# Patient Record
Sex: Male | Born: 1970 | Race: White | Hispanic: No | Marital: Single | Smoking: Never smoker
Health system: Southern US, Community
[De-identification: ages and names within clinical notes are randomized; demographics above are authoritative.]

## PROBLEM LIST (undated history)

## (undated) DIAGNOSIS — R030 Elevated blood-pressure reading, without diagnosis of hypertension: Secondary | ICD-10-CM

## (undated) DIAGNOSIS — M779 Enthesopathy, unspecified: Secondary | ICD-10-CM

## (undated) DIAGNOSIS — K4020 Bilateral inguinal hernia, without obstruction or gangrene, not specified as recurrent: Secondary | ICD-10-CM

## (undated) HISTORY — PX: INGUINAL HERNIA REPAIR: SUR1180

## (undated) HISTORY — PX: LASIK: SHX215

## (undated) HISTORY — PX: SHOULDER SURGERY: SHX246

---

## 2005-02-21 ENCOUNTER — Emergency Department: Payer: Self-pay | Admitting: Emergency Medicine

## 2005-03-01 ENCOUNTER — Ambulatory Visit: Payer: Self-pay | Admitting: General Surgery

## 2006-02-22 ENCOUNTER — Ambulatory Visit: Payer: Self-pay | Admitting: Internal Medicine

## 2006-03-13 ENCOUNTER — Ambulatory Visit: Payer: Self-pay | Admitting: Internal Medicine

## 2006-05-28 ENCOUNTER — Ambulatory Visit: Payer: Self-pay | Admitting: Family Medicine

## 2007-10-23 ENCOUNTER — Emergency Department: Payer: Self-pay | Admitting: Emergency Medicine

## 2007-10-23 ENCOUNTER — Other Ambulatory Visit: Payer: Self-pay

## 2007-10-31 ENCOUNTER — Ambulatory Visit: Payer: Self-pay | Admitting: Internal Medicine

## 2007-11-26 ENCOUNTER — Ambulatory Visit: Payer: Self-pay | Admitting: Orthopaedic Surgery

## 2008-09-16 ENCOUNTER — Ambulatory Visit: Payer: Self-pay | Admitting: Unknown Physician Specialty

## 2008-12-06 ENCOUNTER — Ambulatory Visit: Payer: Self-pay | Admitting: Pain Medicine

## 2013-02-20 ENCOUNTER — Ambulatory Visit: Payer: Self-pay | Admitting: Unknown Physician Specialty

## 2019-11-19 ENCOUNTER — Other Ambulatory Visit: Payer: Self-pay | Admitting: Family Medicine

## 2019-11-19 ENCOUNTER — Ambulatory Visit
Admission: RE | Admit: 2019-11-19 | Discharge: 2019-11-19 | Disposition: A | Discharge status: Home / Self Care | Payer: Worker's Compensation | Source: Ambulatory Visit | Attending: Family Medicine | Admitting: Family Medicine

## 2019-11-19 DIAGNOSIS — T1490XA Injury, unspecified, initial encounter: Secondary | ICD-10-CM

## 2020-09-29 ENCOUNTER — Ambulatory Visit: Payer: Self-pay | Admitting: Dermatology

## 2020-09-29 NOTE — Patient Instructions (Incomplete)

## 2021-11-21 IMAGING — CR DG SHOULDER 2+V*L*
3 series · 3 of 3 positions shown · non-contrast
Comparison: Left shoulder x-rays dated October 24, 2007.

CLINICAL DATA: Left shoulder pain and limited range of motion since
injury 2 weeks ago.

EXAM:
LEFT SHOULDER - 2+ VIEW

[shoulder grashey]
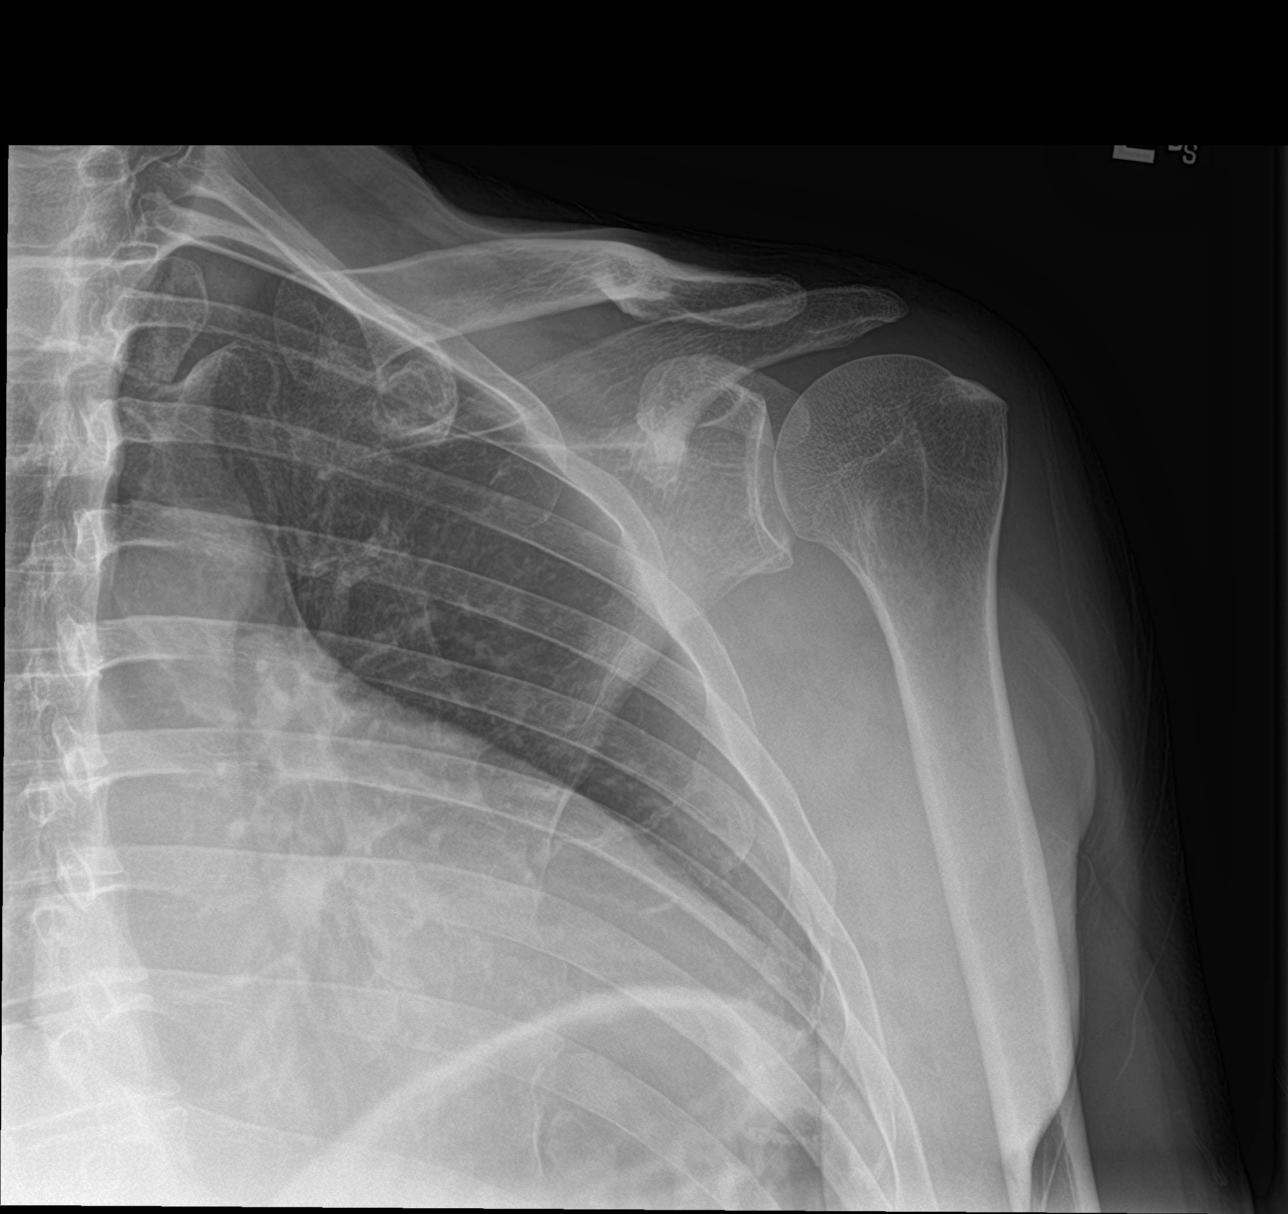

[shoulder y view]
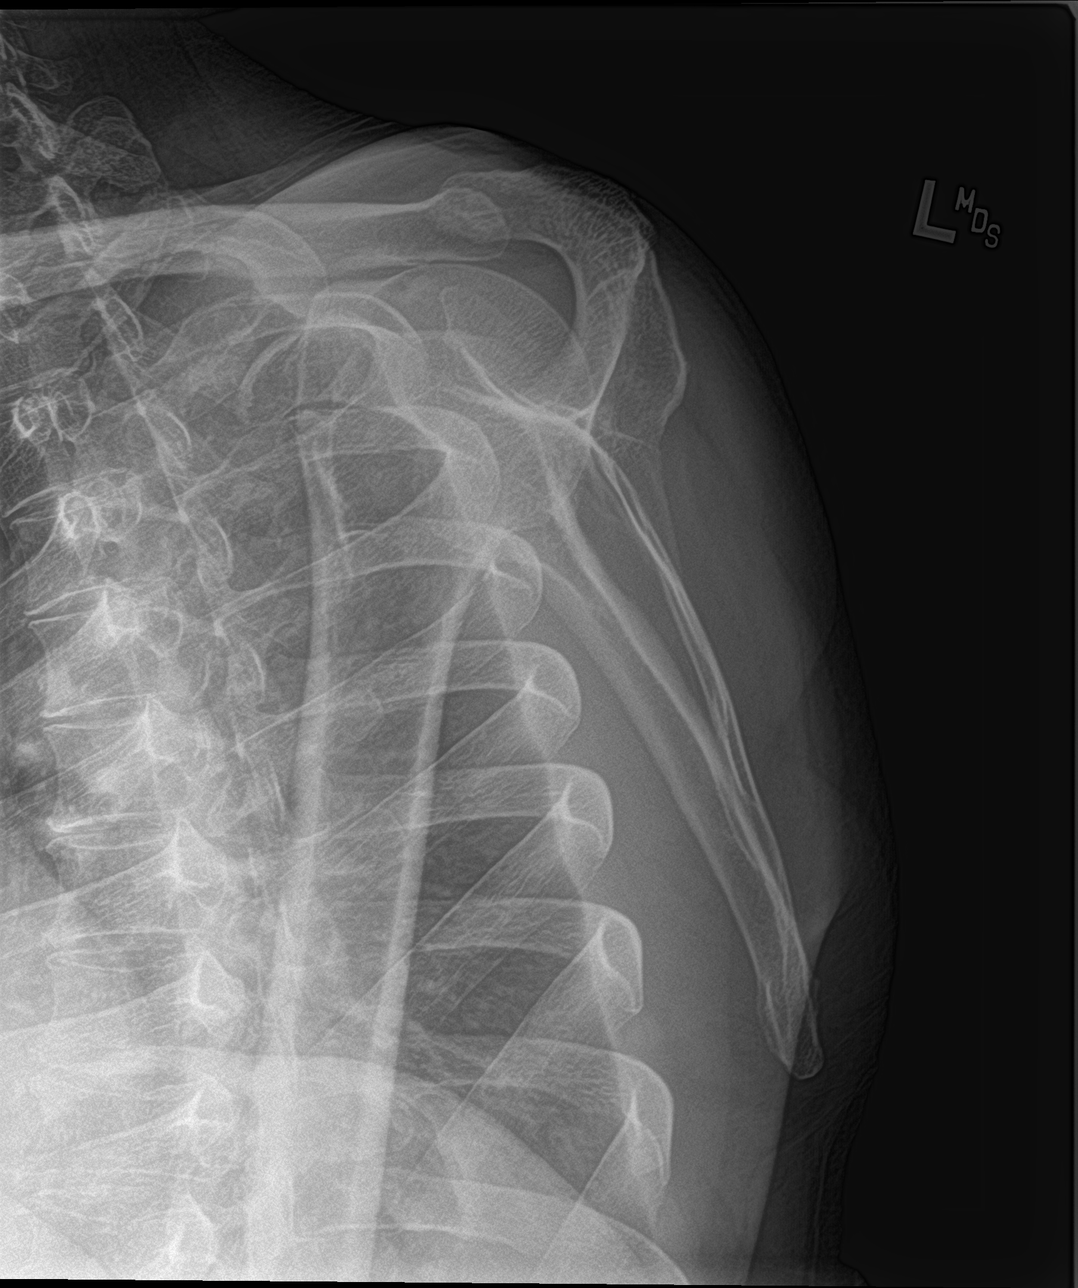

[shoulder axillary]
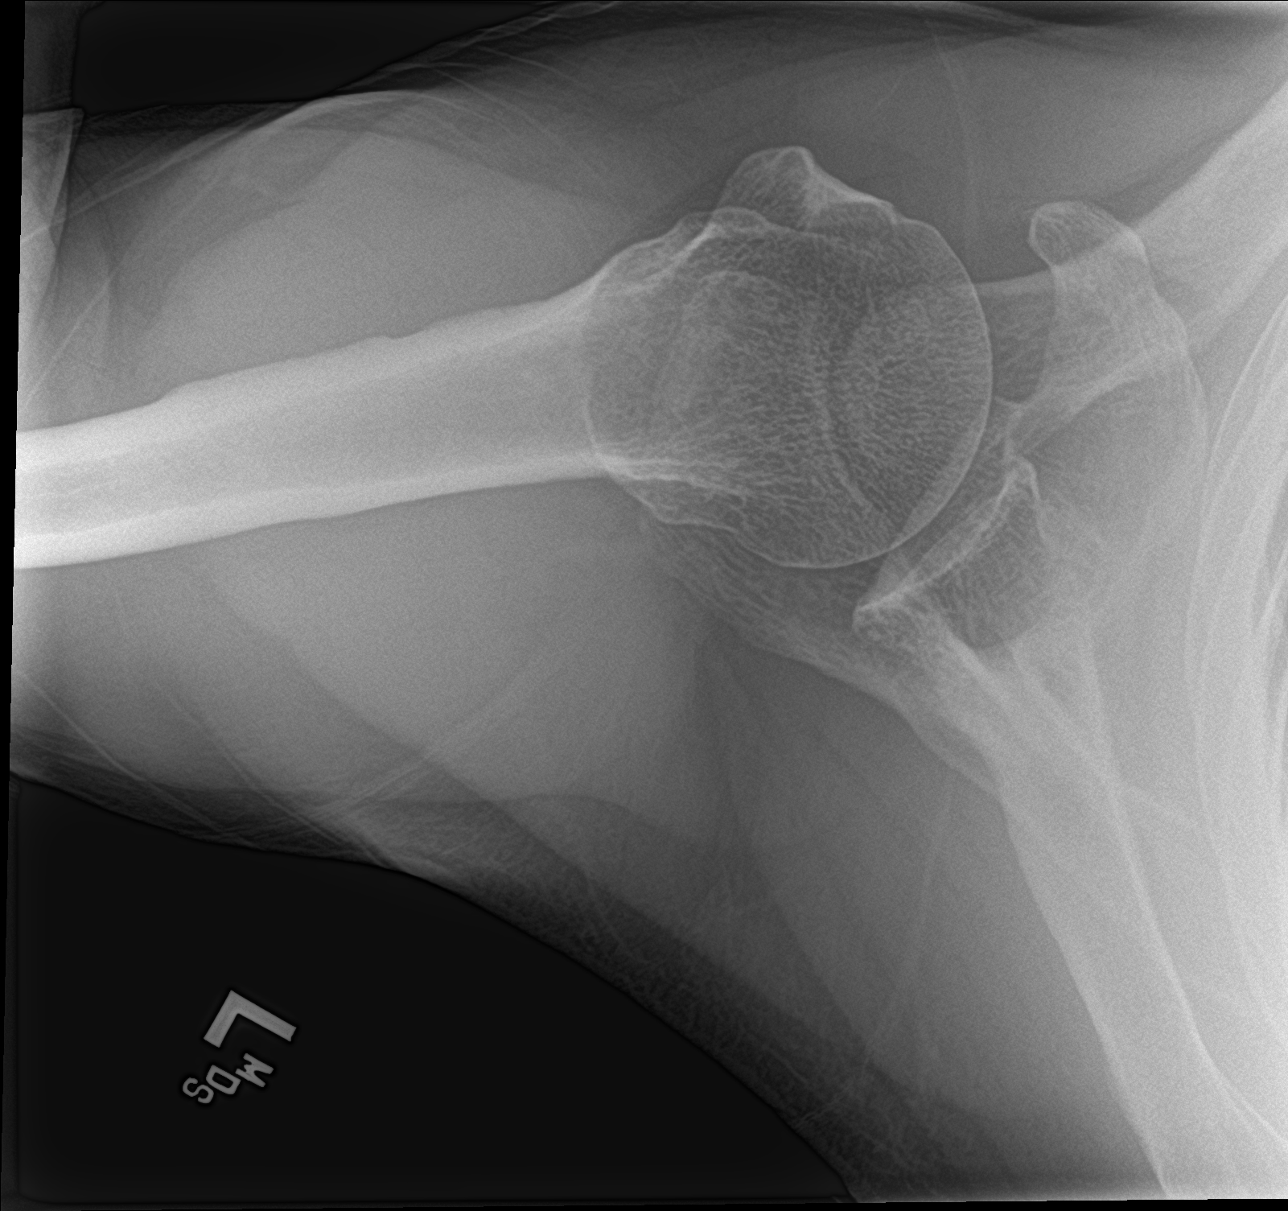

[3 of 3 positions shown; findings below may reference images not displayed]

FINDINGS: There is no evidence of fracture or dislocation. There is no
evidence of arthropathy or other focal bone abnormality. Soft
tissues are unremarkable.
IMPRESSION: Negative.

## 2022-02-16 DEATH — deceased

## 2022-10-08 ENCOUNTER — Ambulatory Visit: Payer: Self-pay | Admitting: Surgery

## 2022-10-08 NOTE — H&P (View-Only) (Signed)
Subjective:   CC: Non-recurrent unilateral inguinal hernia without obstruction or gangrene [K40.90]  HPI:  Victor Coleman is a 51 y.o. male who was referred by Self Referral for evaluation of above. Symptoms were first noted a few years ago. Initially noticed it after lifting at work. Recent worsening of pain so has been out of work. Naproxen not helping.  Associated with nothing, exacerbated by nothing.  Lump is reducible.    Past Medical History: none  Past Surgical History: prior right inguinal hernia repair  Family History: reviewed and not relevant to CC  Social History:  reports that he has never smoked. He has never used smokeless tobacco. He reports that he does not drink alcohol and does not use drugs.  Current Medications: currently has no medications in their medication list.  Allergies:  Allergies as of 10/08/2022   (No Known Allergies)    ROS:  A 15 point review of systems was performed and pertinent positives and negatives noted in HPI   Objective:     BP (!) 188/109   Pulse 110   Ht 175.3 cm (5' 9.02")   Wt 81.7 kg (180 lb 1.9 oz)   BMI 26.59 kg/m   Constitutional :  Alert, cooperative, no distress  Lymphatics/Throat:  Supple, no lymphadenopathy  Respiratory:  clear to auscultation bilaterally  Cardiovascular:  regular rate and rhythm  Gastrointestinal: soft, non-tender; bowel sounds normal; no masses,  no organomegaly. inguinal hernia noted.  moderate, reducible, overlying skin changes, and left  Musculoskeletal: Steady gait and movement  Skin: Cool and moist, right inguinal surgical scars  Psychiatric: Normal affect, non-agitated, not confused       LABS:  N/a   RADS: N/a Assessment:       Non-recurrent unilateral inguinal hernia without obstruction or gangrene [K40.90]  Plan:     1. Non-recurrent unilateral inguinal hernia without obstruction or gangrene [K40.90]   Discussed the risk of surgery including recurrence, which can be up to 50%  in the case of incisional or complex hernias, possible use of prosthetic materials (mesh) and the increased risk of mesh infxn if used, bleeding, chronic pain, post-op infxn, post-op SBO or ileus, and possible re-operation to address said risks. The risks of general anesthetic, if used, includes MI, CVA, sudden death or even reaction to anesthetic medications also discussed. Alternatives include continued observation.  Benefits include possible symptom relief, prevention of incarceration, strangulation, enlargement in size over time, and the risk of emergency surgery in the face of strangulation.   Typical post-op recovery time of 3-5 days with 2 weeks of activity restrictions were also discussed.  ED return precautions given for sudden increase in pain, size of hernia with accompanying fever, nausea, and/or vomiting.  The patient verbalized understanding and all questions were answered to the patient's satisfaction.   2. Patient has elected to proceed with surgical treatment. Procedure will be scheduled. left, robotic assisted laparoscopic  labs/images/medications/previous chart entries reviewed personally and relevant changes/updates noted above.   

## 2022-10-08 NOTE — H&P (Signed)
Subjective:   CC: Non-recurrent unilateral inguinal hernia without obstruction or gangrene [K40.90]  HPI:  Victor Coleman is a 52 y.o. male who was referred by Self Referral for evaluation of above. Symptoms were first noted a few years ago. Initially noticed it after lifting at work. Recent worsening of pain so has been out of work. Naproxen not helping.  Associated with nothing, exacerbated by nothing.  Lump is reducible.    Past Medical History: none  Past Surgical History: prior right inguinal hernia repair  Family History: reviewed and not relevant to CC  Social History:  reports that he has never smoked. He has never used smokeless tobacco. He reports that he does not drink alcohol and does not use drugs.  Current Medications: currently has no medications in their medication list.  Allergies:  Allergies as of 10/08/2022   (No Known Allergies)    ROS:  A 15 point review of systems was performed and pertinent positives and negatives noted in HPI   Objective:     BP (!) 188/109   Pulse 110   Ht 175.3 cm (5' 9.02")   Wt 81.7 kg (180 lb 1.9 oz)   BMI 26.59 kg/m   Constitutional :  Alert, cooperative, no distress  Lymphatics/Throat:  Supple, no lymphadenopathy  Respiratory:  clear to auscultation bilaterally  Cardiovascular:  regular rate and rhythm  Gastrointestinal: soft, non-tender; bowel sounds normal; no masses,  no organomegaly. inguinal hernia noted.  moderate, reducible, overlying skin changes, and left  Musculoskeletal: Steady gait and movement  Skin: Cool and moist, right inguinal surgical scars  Psychiatric: Normal affect, non-agitated, not confused       LABS:  N/a   RADS: N/a Assessment:       Non-recurrent unilateral inguinal hernia without obstruction or gangrene [K40.90]  Plan:     1. Non-recurrent unilateral inguinal hernia without obstruction or gangrene [K40.90]   Discussed the risk of surgery including recurrence, which can be up to 50%  in the case of incisional or complex hernias, possible use of prosthetic materials (mesh) and the increased risk of mesh infxn if used, bleeding, chronic pain, post-op infxn, post-op SBO or ileus, and possible re-operation to address said risks. The risks of general anesthetic, if used, includes MI, CVA, sudden death or even reaction to anesthetic medications also discussed. Alternatives include continued observation.  Benefits include possible symptom relief, prevention of incarceration, strangulation, enlargement in size over time, and the risk of emergency surgery in the face of strangulation.   Typical post-op recovery time of 3-5 days with 2 weeks of activity restrictions were also discussed.  ED return precautions given for sudden increase in pain, size of hernia with accompanying fever, nausea, and/or vomiting.  The patient verbalized understanding and all questions were answered to the patient's satisfaction.   2. Patient has elected to proceed with surgical treatment. Procedure will be scheduled. left, robotic assisted laparoscopic  labs/images/medications/previous chart entries reviewed personally and relevant changes/updates noted above.

## 2022-10-17 ENCOUNTER — Encounter
Admission: RE | Admit: 2022-10-17 | Discharge: 2022-10-17 | Disposition: A | Discharge status: Home / Self Care | Payer: BLUE CROSS/BLUE SHIELD | Source: Ambulatory Visit | Attending: Surgery | Admitting: Surgery

## 2022-10-17 HISTORY — DX: Enthesopathy, unspecified: M77.9

## 2022-10-17 HISTORY — DX: Bilateral inguinal hernia, without obstruction or gangrene, not specified as recurrent: K40.20

## 2022-10-17 HISTORY — DX: Elevated blood-pressure reading, without diagnosis of hypertension: R03.0

## 2022-10-17 NOTE — Patient Instructions (Signed)
Your procedure is scheduled on:10-25-22 Thursday Report to the Registration Desk on the 1st floor of the [facility].Then proceed to the 2nd floor Surgery Desk To find out your arrival time, please call [phone] between 1PM - 3PM on:10-24-22 Wednesday If your arrival time is 6:00 am, do not arrive before that time as the [facility] entrance doors do not open until 6:00 am.  REMEMBER: Instructions that are not followed completely may result in serious medical risk, up to and including death; or upon the discretion of your surgeon and anesthesiologist your surgery may need to be rescheduled.  Do not eat food after midnight the night before surgery.  No gum chewing or hard candies.  You may however, drink CLEAR liquids up to 2 hours before you are scheduled to arrive for your surgery. Do not drink anything within 2 hours of your scheduled arrival time.  Clear liquids include: - water  - apple juice without pulp - gatorade (not RED colors) - black coffee or tea (Do NOT add milk or creamers to the coffee or tea) Do NOT drink anything that is not on this list  One week prior to surgery: Stop Anti-inflammatories (NSAIDS) such as Advil, Aleve, Ibuprofen, Motrin, Naproxen, Naprosyn and Aspirin based products such as Excedrin, Goody's Powder, BC Powder.You may however, take Tylenol if needed for pain up until the day of surgery. Stop ANY OVER THE COUNTER supplements/vitamins NOW (10-17-22) until after surgery (Collagen  Do NOT take any medication the day of surgery  No Alcohol for 24 hours before or after surgery.  No Smoking including e-cigarettes for 24 hours before surgery.  No chewable tobacco products for at least 6 hours before surgery.  No nicotine patches on the day of surgery.  Do not use any "recreational" drugs for at least a week (preferably 2 weeks) before your surgery.  Please be advised that the combination of cocaine and anesthesia may have negative outcomes, up to and  including death. If you test positive for cocaine, your surgery will be cancelled.  On the morning of surgery brush your teeth with toothpaste and water, you may rinse your mouth with mouthwash if you wish. Do not swallow any toothpaste or mouthwash.  Use CHG Soap as directed on instruction sheet.  Do not wear jewelry, make-up, hairpins, clips or nail polish.  Do not wear lotions, powders, or perfumes.   Do not shave body hair from the neck down 48 hours before surgery.  Contact lenses, hearing aids and dentures may not be worn into surgery.  Do not bring valuables to the hospital. [facility] is not responsible for any missing/lost belongings or valuables.    Notify your doctor if there is any change in your medical condition (cold, fever, infection).  Wear comfortable clothing (specific to your surgery type) to the hospital.  After surgery, you can help prevent lung complications by doing breathing exercises.  Take deep breaths and cough every 1-2 hours. Your doctor may order a device called an Incentive Spirometer to help you take deep breaths. When coughing or sneezing, hold a pillow firmly against your incision with both hands. This is called "splinting." Doing this helps protect your incision. It also decreases belly discomfort.  If you are being admitted to the hospital overnight, leave your suitcase in the car. After surgery it may be brought to your room.  In case of increased patient census, it may be necessary for you, the patient, to continue your postoperative care in the Same  Day Surgery department.  If you are being discharged the day of surgery, you will not be allowed to drive home. You will need a responsible individual to drive you home and stay with you for 24 hours after surgery.   If you are taking public transportation, you will need to have a responsible individual with you.  Please call the Pre-admissions Testing Dept. at [phone] if you have any  questions about these instructions.  Surgery Visitation Policy:  Patients having surgery or a procedure may have two visitors.  Children under the age of 9 must have an adult with them who is not the patient.     Preparing for Surgery with CHLORHEXIDINE GLUCONATE (CHG) Soap  Chlorhexidine Gluconate (CHG) Soap  o An antiseptic cleaner that kills germs and bonds with the skin to continue killing germs even after washing  o Used for showering the night before surgery and morning of surgery  Before surgery, you can play an important role by reducing the number of germs on your skin.  CHG (Chlorhexidine gluconate) soap is an antiseptic cleanser which kills germs and bonds with the skin to continue killing germs even after washing.  Please do not use if you have an allergy to CHG or antibacterial soaps. If your skin becomes reddened/irritated stop using the CHG.  1. Shower the NIGHT BEFORE SURGERY and the MORNING OF SURGERY with CHG soap.  2. If you choose to wash your hair, wash your hair first as usual with your normal shampoo.  3. After shampooing, rinse your hair and body thoroughly to remove the shampoo.  4. Use CHG as you would any other liquid soap. You can apply CHG directly to the skin and wash gently with a scrungie or a clean washcloth.  5. Apply the CHG soap to your body only from the neck down. Do not use on open wounds or open sores. Avoid contact with your eyes, ears, mouth, and genitals (private parts). Wash face and genitals (private parts) with your normal soap.  6. Wash thoroughly, paying special attention to the area where your surgery will be performed.  7. Thoroughly rinse your body with warm water.  8. Do not shower/wash with your normal soap after using and rinsing off the CHG soap.  9. Pat yourself dry with a clean towel.  10. Wear clean pajamas to bed the night before surgery.  12. Place clean sheets on your bed the night of your first shower and do not  sleep with pets.  13. Shower again with the CHG soap on the day of surgery prior to arriving at the hospital.  14. Do not apply any deodorants/lotions/powders.  15. Please wear clean clothes to the hospital.

## 2022-10-25 ENCOUNTER — Ambulatory Visit: Payer: BLUE CROSS/BLUE SHIELD | Admitting: Urgent Care

## 2022-10-25 ENCOUNTER — Encounter
Admission: RE | Disposition: A | Discharge status: Home / Self Care | Payer: Self-pay | Source: Home / Self Care | Attending: Surgery

## 2022-10-25 ENCOUNTER — Ambulatory Visit: Payer: BLUE CROSS/BLUE SHIELD | Admitting: Certified Registered"

## 2022-10-25 ENCOUNTER — Ambulatory Visit
Admission: RE | Admit: 2022-10-25 | Discharge: 2022-10-25 | Disposition: A | Discharge status: Home / Self Care | Payer: BLUE CROSS/BLUE SHIELD | Attending: Surgery | Admitting: Surgery

## 2022-10-25 ENCOUNTER — Other Ambulatory Visit: Payer: Self-pay

## 2022-10-25 ENCOUNTER — Encounter: Payer: Self-pay | Admitting: Surgery

## 2022-10-25 DIAGNOSIS — K4090 Unilateral inguinal hernia, without obstruction or gangrene, not specified as recurrent: Secondary | ICD-10-CM | POA: Insufficient documentation

## 2022-10-25 SURGERY — HERNIORRHAPHY, INGUINAL, ROBOT-ASSISTED, LAPAROSCOPIC
Anesthesia: General | Site: Inguinal | Laterality: Left

## 2022-10-25 MED ORDER — ONDANSETRON HCL 4 MG/2ML IJ SOLN: 4.0000 mg | Freq: Once | INTRAMUSCULAR | Status: DC | PRN

## 2022-10-25 MED ORDER — FENTANYL CITRATE (PF) 100 MCG/2ML IJ SOLN
INTRAMUSCULAR | Status: AC
  Filled 2022-10-25: qty 2

## 2022-10-25 MED ORDER — ACETAMINOPHEN 500 MG PO TABS
1000.0000 mg | ORAL_TABLET | ORAL | Status: AC
  Administered 2022-10-25: 1000 mg via ORAL

## 2022-10-25 MED ORDER — FENTANYL CITRATE (PF) 100 MCG/2ML IJ SOLN
INTRAMUSCULAR | Status: DC | PRN
  Administered 2022-10-25 (×2): 50 ug via INTRAVENOUS

## 2022-10-25 MED ORDER — LACTATED RINGERS IV SOLN: INTRAVENOUS | Status: DC

## 2022-10-25 MED ORDER — MIDAZOLAM HCL 2 MG/2ML IJ SOLN
INTRAMUSCULAR | Status: AC
  Filled 2022-10-25: qty 2

## 2022-10-25 MED ORDER — LIDOCAINE HCL (CARDIAC) PF 100 MG/5ML IV SOSY
PREFILLED_SYRINGE | INTRAVENOUS | Status: DC | PRN
  Administered 2022-10-25: 80 mg via INTRAVENOUS

## 2022-10-25 MED ORDER — BUPIVACAINE LIPOSOME 1.3 % IJ SUSP
INTRAMUSCULAR | Status: DC | PRN
  Administered 2022-10-25: 20 mL

## 2022-10-25 MED ORDER — BUPIVACAINE HCL (PF) 0.5 % IJ SOLN
INTRAMUSCULAR | Status: AC
  Filled 2022-10-25: qty 30

## 2022-10-25 MED ORDER — ROCURONIUM BROMIDE 100 MG/10ML IV SOLN
INTRAVENOUS | Status: DC | PRN
  Administered 2022-10-25: 40 mg via INTRAVENOUS
  Administered 2022-10-25: 30 mg via INTRAVENOUS
  Administered 2022-10-25: 10 mg via INTRAVENOUS

## 2022-10-25 MED ORDER — EPINEPHRINE PF 1 MG/ML IJ SOLN
INTRAMUSCULAR | Status: AC
  Filled 2022-10-25: qty 1

## 2022-10-25 MED ORDER — CHLORHEXIDINE GLUCONATE CLOTH 2 % EX PADS: 6.0000 | MEDICATED_PAD | Freq: Once | CUTANEOUS | Status: DC

## 2022-10-25 MED ORDER — HYDROCODONE-ACETAMINOPHEN 5-325 MG PO TABS: 1.0000 | ORAL_TABLET | Freq: Four times a day (QID) | ORAL | 0 refills | Status: AC | PRN

## 2022-10-25 MED ORDER — SUGAMMADEX SODIUM 200 MG/2ML IV SOLN
INTRAVENOUS | Status: DC | PRN
  Administered 2022-10-25: 200 mg via INTRAVENOUS

## 2022-10-25 MED ORDER — IBUPROFEN 800 MG PO TABS: 800.0000 mg | ORAL_TABLET | Freq: Three times a day (TID) | ORAL | 0 refills | Status: AC | PRN

## 2022-10-25 MED ORDER — ORAL CARE MOUTH RINSE: 15.0000 mL | Freq: Once | OROMUCOSAL | Status: AC

## 2022-10-25 MED ORDER — DOCUSATE SODIUM 100 MG PO CAPS: 100.0000 mg | ORAL_CAPSULE | Freq: Two times a day (BID) | ORAL | 0 refills | Status: AC | PRN

## 2022-10-25 MED ORDER — ACETAMINOPHEN 10 MG/ML IV SOLN: 1000.0000 mg | Freq: Once | INTRAVENOUS | Status: DC | PRN

## 2022-10-25 MED ORDER — CELECOXIB 200 MG PO CAPS
ORAL_CAPSULE | ORAL | Status: AC
  Filled 2022-10-25: qty 1

## 2022-10-25 MED ORDER — ALBUTEROL SULFATE HFA 108 (90 BASE) MCG/ACT IN AERS
INHALATION_SPRAY | RESPIRATORY_TRACT | Status: AC
  Filled 2022-10-25: qty 6.7

## 2022-10-25 MED ORDER — LACTATED RINGERS IV SOLN: INTRAVENOUS | Status: DC | PRN

## 2022-10-25 MED ORDER — ACETAMINOPHEN 325 MG PO TABS: 650.0000 mg | ORAL_TABLET | Freq: Three times a day (TID) | ORAL | 0 refills | Status: AC | PRN

## 2022-10-25 MED ORDER — ONDANSETRON HCL 4 MG/2ML IJ SOLN
INTRAMUSCULAR | Status: DC | PRN
  Administered 2022-10-25: 4 mg via INTRAVENOUS

## 2022-10-25 MED ORDER — BUPIVACAINE LIPOSOME 1.3 % IJ SUSP
INTRAMUSCULAR | Status: AC
  Filled 2022-10-25: qty 20

## 2022-10-25 MED ORDER — BUPIVACAINE-EPINEPHRINE 0.5% -1:200000 IJ SOLN
INTRAMUSCULAR | Status: DC | PRN
  Administered 2022-10-25: 17 mL

## 2022-10-25 MED ORDER — CHLORHEXIDINE GLUCONATE 0.12 % MT SOLN
15.0000 mL | Freq: Once | OROMUCOSAL | Status: AC
  Administered 2022-10-25: 15 mL via OROMUCOSAL

## 2022-10-25 MED ORDER — OXYCODONE HCL 5 MG/5ML PO SOLN: 5.0000 mg | Freq: Once | ORAL | Status: AC | PRN

## 2022-10-25 MED ORDER — OXYCODONE HCL 5 MG PO TABS
5.0000 mg | ORAL_TABLET | Freq: Once | ORAL | Status: AC | PRN
  Administered 2022-10-25: 5 mg via ORAL

## 2022-10-25 MED ORDER — FAMOTIDINE 20 MG PO TABS
ORAL_TABLET | ORAL | Status: AC
  Filled 2022-10-25: qty 1

## 2022-10-25 MED ORDER — CEFAZOLIN SODIUM-DEXTROSE 2-4 GM/100ML-% IV SOLN
2.0000 g | INTRAVENOUS | Status: AC
  Administered 2022-10-25: 2 g via INTRAVENOUS

## 2022-10-25 MED ORDER — PROPOFOL 10 MG/ML IV BOLUS
INTRAVENOUS | Status: DC | PRN
  Administered 2022-10-25: 100 mg via INTRAVENOUS
  Administered 2022-10-25: 200 mg via INTRAVENOUS

## 2022-10-25 MED ORDER — CHLORHEXIDINE GLUCONATE 0.12 % MT SOLN
OROMUCOSAL | Status: AC
  Filled 2022-10-25: qty 15

## 2022-10-25 MED ORDER — FAMOTIDINE 20 MG PO TABS
20.0000 mg | ORAL_TABLET | Freq: Once | ORAL | Status: AC
  Administered 2022-10-25: 20 mg via ORAL

## 2022-10-25 MED ORDER — DEXAMETHASONE SODIUM PHOSPHATE 10 MG/ML IJ SOLN
INTRAMUSCULAR | Status: DC | PRN
  Administered 2022-10-25: 10 mg via INTRAVENOUS

## 2022-10-25 MED ORDER — ACETAMINOPHEN 500 MG PO TABS
ORAL_TABLET | ORAL | Status: AC
  Filled 2022-10-25: qty 2

## 2022-10-25 MED ORDER — FENTANYL CITRATE (PF) 100 MCG/2ML IJ SOLN
25.0000 ug | INTRAMUSCULAR | Status: DC | PRN
  Administered 2022-10-25 (×4): 25 ug via INTRAVENOUS
  Administered 2022-10-25: 50 ug via INTRAVENOUS

## 2022-10-25 MED ORDER — GABAPENTIN 300 MG PO CAPS
ORAL_CAPSULE | ORAL | Status: AC
  Filled 2022-10-25: qty 1

## 2022-10-25 MED ORDER — OXYCODONE HCL 5 MG PO TABS
ORAL_TABLET | ORAL | Status: AC
  Filled 2022-10-25: qty 1

## 2022-10-25 MED ORDER — ALBUTEROL SULFATE HFA 108 (90 BASE) MCG/ACT IN AERS
INHALATION_SPRAY | RESPIRATORY_TRACT | Status: DC | PRN
  Administered 2022-10-25: 4 via RESPIRATORY_TRACT

## 2022-10-25 MED ORDER — CELECOXIB 200 MG PO CAPS
200.0000 mg | ORAL_CAPSULE | ORAL | Status: AC
  Administered 2022-10-25: 200 mg via ORAL

## 2022-10-25 MED ORDER — HYDROMORPHONE HCL 1 MG/ML IJ SOLN
INTRAMUSCULAR | Status: AC
  Filled 2022-10-25: qty 1

## 2022-10-25 MED ORDER — HYDROMORPHONE HCL 1 MG/ML IJ SOLN
INTRAMUSCULAR | Status: DC | PRN
  Administered 2022-10-25: .2 mg via INTRAVENOUS
  Administered 2022-10-25: .3 mg via INTRAVENOUS
  Administered 2022-10-25: .2 mg via INTRAVENOUS
  Administered 2022-10-25: .3 mg via INTRAVENOUS

## 2022-10-25 MED ORDER — MIDAZOLAM HCL 2 MG/2ML IJ SOLN
INTRAMUSCULAR | Status: DC | PRN
  Administered 2022-10-25: 2 mg via INTRAVENOUS

## 2022-10-25 MED ORDER — GABAPENTIN 300 MG PO CAPS
300.0000 mg | ORAL_CAPSULE | ORAL | Status: AC
  Administered 2022-10-25: 300 mg via ORAL

## 2022-10-25 SURGICAL SUPPLY — 50 items
ADH SKN CLS APL DERMABOND .7 (GAUZE/BANDAGES/DRESSINGS) ×1
BAG PRESSURE INF REUSE 1000 (BAG) IMPLANT
BLADE SURG SZ11 CARB STEEL (BLADE) ×1 IMPLANT
BNDG GAUZE DERMACEA FLUFF 4 (GAUZE/BANDAGES/DRESSINGS) ×1 IMPLANT
BNDG GZE DERMACEA 4 6PLY (GAUZE/BANDAGES/DRESSINGS) ×1
COVER TIP SHEARS 8 DVNC (MISCELLANEOUS) ×1 IMPLANT
COVER WAND RF STERILE (DRAPES) ×1 IMPLANT
DERMABOND ADVANCED .7 DNX12 (GAUZE/BANDAGES/DRESSINGS) ×1 IMPLANT
DRAPE ARM DVNC X/XI (DISPOSABLE) ×3 IMPLANT
DRAPE COLUMN DVNC XI (DISPOSABLE) ×1 IMPLANT
ELECT CAUTERY BLADE 6.4 (BLADE) IMPLANT
ELECT REM PT RETURN 9FT ADLT (ELECTROSURGICAL) ×1
ELECTRODE REM PT RTRN 9FT ADLT (ELECTROSURGICAL) ×1 IMPLANT
FORCEPS BPLR FENES DVNC XI (FORCEP) ×1 IMPLANT
GLOVE BIOGEL PI IND STRL 7.0 (GLOVE) ×2 IMPLANT
GLOVE SURG SYN 6.5 ES PF (GLOVE) ×3 IMPLANT
GLOVE SURG SYN 6.5 PF PI (GLOVE) ×2 IMPLANT
GOWN STRL REUS W/ TWL LRG LVL3 (GOWN DISPOSABLE) ×3 IMPLANT
GOWN STRL REUS W/TWL LRG LVL3 (GOWN DISPOSABLE) ×3
IRRIGATOR SUCT 8 DISP DVNC XI (IRRIGATION / IRRIGATOR) IMPLANT
IV NS 1000ML (IV SOLUTION)
IV NS 1000ML BAXH (IV SOLUTION) IMPLANT
LABEL OR SOLS (LABEL) IMPLANT
MANIFOLD NEPTUNE II (INSTRUMENTS) ×1 IMPLANT
MESH 3DMAX MID 4X6 LT LRG (Mesh General) IMPLANT
NDL DRIVE SUT CUT DVNC (INSTRUMENTS) ×1 IMPLANT
NDL HYPO 22X1.5 SAFETY MO (MISCELLANEOUS) ×1 IMPLANT
NDL INSUFFLATION 14GA 120MM (NEEDLE) ×1 IMPLANT
NEEDLE DRIVE SUT CUT DVNC (INSTRUMENTS) ×1 IMPLANT
NEEDLE HYPO 22X1.5 SAFETY MO (MISCELLANEOUS) ×1 IMPLANT
NEEDLE INSUFFLATION 14GA 120MM (NEEDLE) ×1 IMPLANT
OBTURATOR OPTICAL STND 8 DVNC (TROCAR) ×1
OBTURATOR OPTICALSTD 8 DVNC (TROCAR) ×1 IMPLANT
PACK LAP CHOLECYSTECTOMY (MISCELLANEOUS) ×1 IMPLANT
PENCIL SMOKE EVACUATOR (MISCELLANEOUS) ×1 IMPLANT
SCISSORS MNPLR CVD DVNC XI (INSTRUMENTS) ×1 IMPLANT
SEAL UNIV 5-12 XI (MISCELLANEOUS) ×3 IMPLANT
SET TUBE SMOKE EVAC HIGH FLOW (TUBING) ×1 IMPLANT
SOL ELECTROSURG ANTI STICK (MISCELLANEOUS) ×1
SOLUTION ELECTROSURG ANTI STCK (MISCELLANEOUS) ×1 IMPLANT
SUT MNCRL 4-0 (SUTURE) ×1
SUT MNCRL 4-0 27XMFL (SUTURE) ×1
SUT VIC AB 2-0 SH 27 (SUTURE) ×1
SUT VIC AB 2-0 SH 27XBRD (SUTURE) ×1 IMPLANT
SUT VLOC 90 6 CV-15 VIOLET (SUTURE) ×2 IMPLANT
SUTURE MNCRL 4-0 27XMF (SUTURE) ×1 IMPLANT
SYR 30ML LL (SYRINGE) ×1 IMPLANT
TAPE TRANSPORE STRL 2 31045 (GAUZE/BANDAGES/DRESSINGS) ×1 IMPLANT
TRAP FLUID SMOKE EVACUATOR (MISCELLANEOUS) ×1 IMPLANT
WATER STERILE IRR 500ML POUR (IV SOLUTION) ×1 IMPLANT

## 2022-10-25 NOTE — Anesthesia Postprocedure Evaluation (Signed)
Anesthesia Post Note  Patient: Victor Coleman  Procedure(s) Performed: XI ROBOTIC ASSISTED INGUINAL HERNIA w/ mesh (Left: Inguinal)  Patient location during evaluation: PACU Anesthesia Type: General Level of consciousness: awake and alert, oriented and patient cooperative Pain management: pain level controlled Vital Signs Assessment: post-procedure vital signs reviewed and stable Respiratory status: spontaneous breathing, nonlabored ventilation and respiratory function stable Cardiovascular status: blood pressure returned to baseline and stable Postop Assessment: adequate PO intake Anesthetic complications: no   No notable events documented.   Last Vitals:  Vitals:   10/25/22 1215 10/25/22 1220  BP:  119/76  Pulse: 82 92  Resp: (!) 8 12  Temp:    SpO2: 93% 93%    Last Pain:  Vitals:   10/25/22 1220  TempSrc:   PainSc: 4                  Reed Breech

## 2022-10-25 NOTE — Op Note (Signed)
Preoperative diagnosis: left, initial, reducible inguinal Hernia.  Postoperative diagnosis: left inguinal Hernia  Procedure: Robotic assisted laparoscopic left inguinal hernia repair with mesh  Anesthesia: General  Surgeon: Dr. Tonna Boehringer  Wound Classification: Clean  Specimen: none  Complications: None  Estimated Blood Loss: 10mL   Indications:  inguinal hernia. Repair was indicated to avoid complications of incarceration, obstruction and pain, and a prosthetic mesh repair was elected.  See H&P for further details.  Findings: 1. Vas Deferens and cord structures identified and preserved 2. Bard 3D max medium weight mesh used for repair 3. Adequate hemostasis achieved  Description of procedure: The patient was taken to the operating room. A time-out was completed verifying correct patient, procedure, site, positioning, and implant(s) and/or special equipment prior to beginning this procedure.  Area was prepped and draped in the usual sterile fashion. An incision was marked 20 cm above the pubic tubercle, slightly above the umbilicus  Scrotum wrapped in Kerlix roll.  Veress needle inserted at palmer's point.  Saline drop test noted to be positive with gradual increase in pressure after initiation of gas insufflation.  15 mm of pressure was achieved prior to removing the Veress needle and then placing a 8 mm port via the Optiview technique through the supraumbilical site.  Inspection of the area afterwards noted no injury to the surrounding organs during insertion of the needle and the port.  2 port sites were marked 8 cm to the lateral sides of the initial port, and a 8 mm robotic port was placed on the left side, another 8 mm robotic port on the right side under direct supervision.  Local anesthesia  infused to the preplanned incision sites prior to insertion of the port.  The [employer] platform was then brought into the operative field and docked to the ports.  Examination of the  abdominal cavity noted a left inguinal hernia with colon.  The colon gently reduced out of defect with traction and some lysis of adhesions. A peritoneal flap was created approximately 8cm cephalad to the defect by using scissors with electrocautery.  Dissection was carried down towards the pubic tubercle, developing the myopectineal orifice view.  Laterally the flap was carried towards the ASIS.  large hernia sac was noted, which carefully dissected away from the adjacent tissues to be fully reduced out of hernia cavity.  Any bleeding was controlled with combination of electrocautery and manual pressure.    After confirming adequate dissection and the peritoneal reflection completely down and away from the cord structures, a Large Bard 3DMax medium weight mesh was placed within the anterior abdominal wall, secured in place using 2-0 Vicryl on an SH needle immediately above the pubic tubercle.  After noting proper placement of the mesh with the peritoneal reflection deep to it, the previously created peritoneal flap was secured back up to the anterior abdominal wall using running 3-0 V-Lock.  Both needles were then removed out of the abdominal cavity, Xi platform undocked from the ports and removed off of operative field.  exparel infused as ilioinguinal block.  Abdomen then desufflated and ports removed. All the skin incisions were then closed with a subcuticular stitch of Monocryl 4-0. Dermabond was applied. The testis was gently pulled down into its anatomic position in the scrotum.  The patient tolerated the procedure well and was taken to the postanesthesia care unit in stable condition. Sponge and instrument count correct at end of procedure.

## 2022-10-25 NOTE — Discharge Instructions (Addendum)
Hernia repair, Care After This sheet gives you information about how to care for yourself after your procedure. Your health care provider may also give you more specific instructions. If you have problems or questions, contact your health care provider. What can I expect after the procedure? After your procedure, it is common to have the following: Pain in your abdomen, especially in the incision areas. You will be given medicine to control the pain. Tiredness. This is a normal part of the recovery process. Your energy level will return to normal over the next several weeks. Changes in your bowel movements, such as constipation or needing to go more often. Talk with your health care provider about how to manage this. Follow these instructions at home: Medicines  tylenol and advil as needed for discomfort.  Please alternate between the two every four hours as needed for pain.    Use narcotics, if prescribed, only when tylenol and motrin is not enough to control pain.  325-650mg every 8hrs to max of 3000mg/24hrs (including the 325mg in every norco dose) for the tylenol.    Advil up to 800mg per dose every 8hrs as needed for pain.   PLEASE RECORD NUMBER OF PILLS TAKEN UNTIL NEXT FOLLOW UP APPT.  THIS WILL HELP DETERMINE HOW READY YOU ARE TO BE RELEASED FROM ANY ACTIVITY RESTRICTIONS Do not drive or use heavy machinery while taking prescription pain medicine. Do not drink alcohol while taking prescription pain medicine.  Incision care    Follow instructions from your health care provider about how to take care of your incision areas. Make sure you: Keep your incisions clean and dry. Wash your hands with soap and water before and after applying medicine to the areas, and before and after changing your bandage (dressing). If soap and water are not available, use hand sanitizer. Change your dressing as told by your health care provider. Leave stitches (sutures), skin glue, or adhesive strips in  place. These skin closures may need to stay in place for 2 weeks or longer. If adhesive strip edges start to loosen and curl up, you may trim the loose edges. Do not remove adhesive strips completely unless your health care provider tells you to do that. Do not wear tight clothing over the incisions. Tight clothing may rub and irritate the incision areas, which may cause the incisions to open. Do not take baths, swim, or use a hot tub until your health care provider approves. OK TO SHOWER IN 24HRS.   Check your incision area every day for signs of infection. Check for: More redness, swelling, or pain. More fluid or blood. Warmth. Pus or a bad smell. Activity Avoid lifting anything that is heavier than 10 lb (4.5 kg) for 2 weeks or until your health care provider says it is okay. No pushing/pulling greater than 30lbs You may resume normal activities as told by your health care provider. Ask your health care provider what activities are safe for you. Take rest breaks during the day as needed. Eating and drinking Follow instructions from your health care provider about what you can eat after surgery. To prevent or treat constipation while you are taking prescription pain medicine, your health care provider may recommend that you: Drink enough fluid to keep your urine clear or pale yellow. Take over-the-counter or prescription medicines. Eat foods that are high in fiber, such as fresh fruits and vegetables, whole grains, and beans. Limit foods that are high in fat and processed sugars, such as fried and   sweet foods. General instructions Ask your health care provider when you will need an appointment to get your sutures or staples removed. Keep all follow-up visits as told by your health care provider. This is important. Contact a health care provider if: You have more redness, swelling, or pain around your incisions. You have more fluid or blood coming from the incisions. Your incisions feel  warm to the touch. You have pus or a bad smell coming from your incisions or your dressing. You have a fever. You have an incision that breaks open (edges not staying together) after sutures or staples have been removed. You develop a rash. You have chest pain or difficulty breathing. You have pain or swelling in your legs. You feel light-headed or you faint. Your abdomen swells (becomes distended). You have nausea or vomiting. You have blood in your stool (feces). This information is not intended to replace advice given to you by your health care provider. Make sure you discuss any questions you have with your health care provider. Document Released: 12/22/2004 Document Revised: 02/21/2018 Document Reviewed: 03/05/2016 Elsevier Interactive Patient Education  2019 [employer]   AMBULATORY SURGERY  DISCHARGE INSTRUCTIONS   The drugs that you were given will stay in your system until tomorrow so for the next 24 hours you should not:  Drive an automobile Make any legal decisions Drink any alcoholic beverage   You may resume regular meals tomorrow.  Today it is better to start with liquids and gradually work up to solid foods.  You may eat anything you prefer, but it is better to start with liquids, then soup and crackers, and gradually work up to solid foods.   Please notify your doctor immediately if you have any unusual bleeding, trouble breathing, redness and pain at the surgery site, drainage, fever, or pain not relieved by medication.    Additional Instructions:        Please contact your physician with any problems or Same Day Surgery at [phone], Monday through Friday 6 am to 4 pm, or [facility] Main number at [phone].  

## 2022-10-25 NOTE — Transfer of Care (Signed)
Immediate Anesthesia Transfer of Care Note  Patient: JOSTON PATTON  Procedure(s) Performed: XI ROBOTIC ASSISTED INGUINAL HERNIA w/ mesh (Left: Inguinal)  Patient Location: PACU  Anesthesia Type:General  Level of Consciousness: awake  Airway & Oxygen Therapy: Patient Spontanous Breathing and Patient connected to face mask oxygen  Post-op Assessment: Report given to RN and Post -op Vital signs reviewed and stable  Post vital signs: Reviewed  Last Vitals:  Vitals Value Taken Time  BP 128/85 10/25/22 1150  Temp 36.4 C 10/25/22 1150  Pulse 91 10/25/22 1156  Resp 13 10/25/22 1156  SpO2 98 % 10/25/22 1156  Vitals shown include unvalidated device data.  Last Pain:  Vitals:   10/25/22 0936  TempSrc: Temporal  PainSc: 0-No pain         Complications: No notable events documented.

## 2022-10-25 NOTE — Anesthesia Procedure Notes (Signed)
Procedure Name: Intubation Date/Time: 10/25/2022 10:29 AM  Performed by: Merlene Pulling, CRNAPre-anesthesia Checklist: Patient identified, Patient being monitored, Timeout performed, Emergency Drugs available and Suction available Patient Re-evaluated:Patient Re-evaluated prior to induction Oxygen Delivery Method: Circle system utilized Preoxygenation: Pre-oxygenation with 100% oxygen Induction Type: IV induction Ventilation: Mask ventilation without difficulty Laryngoscope Size: McGraph and 4 Grade View: Grade II Tube type: Oral Tube size: 7.0 mm Number of attempts: 2 Airway Equipment and Method: Stylet Placement Confirmation: ETT inserted through vocal cords under direct vision, positive ETCO2 and breath sounds checked- equal and bilateral Secured at: 22 cm Tube secured with: Tape Dental Injury: Teeth and Oropharynx as per pre-operative assessment  Comments: Narrow palate, small mouth opening and anterior airway upon VL. Airway established with McGrath 4, cricoid pressure (bringing glottic opening to a more central view).

## 2022-10-25 NOTE — Anesthesia Preprocedure Evaluation (Signed)
Anesthesia Evaluation  Patient identified by MRN, date of birth, ID band Patient awake    Reviewed: Allergy & Precautions, NPO status , Patient's Chart, lab work & pertinent test results  History of Anesthesia Complications Negative for: history of anesthetic complications  Airway Mallampati: I   Neck ROM: Full    Dental no notable dental hx.    Pulmonary neg pulmonary ROS   Pulmonary exam normal breath sounds clear to auscultation       Cardiovascular Exercise Tolerance: Good negative cardio ROS Normal cardiovascular exam Rhythm:Regular Rate:Normal     Neuro/Psych negative neurological ROS     GI/Hepatic negative GI ROS,,,  Endo/Other  negative endocrine ROS    Renal/GU negative Renal ROS     Musculoskeletal   Abdominal   Peds  Hematology negative hematology ROS (+)   Anesthesia Other Findings   Reproductive/Obstetrics                             Anesthesia Physical Anesthesia Plan  ASA: 1  Anesthesia Plan: General   Post-op Pain Management:    Induction: Intravenous  PONV Risk Score and Plan: 2 and Ondansetron, Dexamethasone and Treatment may vary due to age or medical condition  Airway Management Planned: Oral ETT  Additional Equipment:   Intra-op Plan:   Post-operative Plan: Extubation in OR  Informed Consent: I have reviewed the patients History and Physical, chart, labs and discussed the procedure including the risks, benefits and alternatives for the proposed anesthesia with the patient or authorized representative who has indicated his/her understanding and acceptance.     Dental advisory given  Plan Discussed with: CRNA  Anesthesia Plan Comments: (Patient consented for risks of anesthesia including but not limited to:  - adverse reactions to medications - damage to eyes, teeth, lips or other oral mucosa - nerve damage due to positioning  - sore throat or  hoarseness - damage to heart, brain, nerves, lungs, other parts of body or loss of life  Informed patient about role of CRNA in peri- and intra-operative care.  Patient voiced understanding.)       Anesthesia Quick Evaluation  

## 2022-10-25 NOTE — Interval H&P Note (Signed)
History and Physical Interval Note:  10/25/2022 11:59 AM  Victor Coleman  has presented today for surgery, with the diagnosis of non-recurrent unilateral inguinal hernia w/o obstruction or gangrene K40.90.  The various methods of treatment have been discussed with the patient and family. After consideration of risks, benefits and other options for treatment, the patient has consented to  Procedure(s): XI ROBOTIC ASSISTED INGUINAL HERNIA w/ mesh (Left) as a surgical intervention.  The patient's history has been reviewed, patient examined, no change in status, stable for surgery.  I have reviewed the patient's chart and labs.  Questions were answered to the patient's satisfaction.     Kemauri Musa Tonna Boehringer

## 2022-11-14 ENCOUNTER — Other Ambulatory Visit: Payer: Self-pay | Admitting: Family Medicine

## 2022-11-14 DIAGNOSIS — R011 Cardiac murmur, unspecified: Secondary | ICD-10-CM

## 2022-12-04 ENCOUNTER — Ambulatory Visit: Payer: BLUE CROSS/BLUE SHIELD

## 2022-12-07 ENCOUNTER — Ambulatory Visit
Admission: RE | Admit: 2022-12-07 | Discharge: 2022-12-07 | Disposition: A | Discharge status: Home / Self Care | Payer: BLUE CROSS/BLUE SHIELD | Source: Ambulatory Visit | Attending: Family Medicine | Admitting: Family Medicine

## 2022-12-07 DIAGNOSIS — I080 Rheumatic disorders of both mitral and aortic valves: Secondary | ICD-10-CM | POA: Diagnosis present

## 2022-12-07 DIAGNOSIS — R011 Cardiac murmur, unspecified: Secondary | ICD-10-CM | POA: Diagnosis not present

## 2022-12-07 LAB — ECHOCARDIOGRAM COMPLETE
AR max vel: 3.07 cm2
AV Area VTI: 3.54 cm2
AV Area mean vel: 3.24 cm2
AV Mean grad: 3 mmHg
AV Peak grad: 8.1 mmHg
Ao pk vel: 1.42 m/s
Area-P 1/2: 4.46 cm2
MV M vel: 5.04 m/s
MV Peak grad: 101.6 mmHg
MV VTI: 2.35 cm2
Radius: 0.75 cm
S' Lateral: 2.9 cm

## 2022-12-07 NOTE — Progress Notes (Signed)
*  PRELIMINARY RESULTS* Echocardiogram 2D Echocardiogram has been performed.  Victor Coleman 12/07/2022, 10:00 AM

## 2022-12-10 ENCOUNTER — Other Ambulatory Visit: Payer: BLUE CROSS/BLUE SHIELD

## 2023-03-21 LAB — EXTERNAL GENERIC LAB PROCEDURE
